# Patient Record
Sex: Female | Born: 1970 | Race: White | Hispanic: No | Marital: Married | Smoking: Never smoker
Health system: Southern US, Community
[De-identification: ages and names within clinical notes are randomized; demographics above are authoritative.]

## PROBLEM LIST (undated history)

## (undated) HISTORY — PX: TEMPOROMANDIBULAR JOINT SURGERY: SHX35

---

## 2011-05-21 ENCOUNTER — Other Ambulatory Visit (HOSPITAL_COMMUNITY): Payer: Self-pay | Admitting: Obstetrics and Gynecology

## 2011-05-21 DIAGNOSIS — N979 Female infertility, unspecified: Secondary | ICD-10-CM

## 2011-05-26 ENCOUNTER — Ambulatory Visit (HOSPITAL_COMMUNITY)
Admission: RE | Admit: 2011-05-26 | Discharge: 2011-05-26 | Disposition: A | Discharge status: Home / Self Care | Payer: BC Managed Care – PPO | Source: Ambulatory Visit | Attending: Obstetrics and Gynecology | Admitting: Obstetrics and Gynecology

## 2011-05-26 DIAGNOSIS — N979 Female infertility, unspecified: Secondary | ICD-10-CM

## 2011-05-26 MED ORDER — IOHEXOL 300 MG/ML  SOLN
7.0000 mL | Freq: Once | INTRAMUSCULAR | Status: AC | PRN
  Administered 2011-05-26: 7 mL

## 2011-08-04 NOTE — L&D Delivery Note (Signed)
Delivery Note At 9:06 AM a viable and healthy female was delivered via Vaginal, Spontaneous Delivery (Presentation: Right Occiput Anterior).  APGAR: 2, 9; weight 6 lb 1.9 oz (2775 g).   Placenta status: Intact, Spontaneous Pathology.  Cord: 3 vessels with the following complications: None.  Cord pH: none.   Anesthesia: Epidural  Episiotomy: None Lacerations: 1st degree Suture Repair: 3.0 chromic Est. Blood Loss (mL): 350  Mom to postpartum.  Baby to nursery-stable.  Lizet Kelso A 04/28/2012, 1:58 PM

## 2011-09-14 LAB — OB RESULTS CONSOLE ABO/RH

## 2011-09-14 LAB — OB RESULTS CONSOLE RUBELLA ANTIBODY, IGM: Rubella: IMMUNE

## 2011-09-14 LAB — OB RESULTS CONSOLE ANTIBODY SCREEN: Antibody Screen: NEGATIVE

## 2011-09-14 LAB — OB RESULTS CONSOLE HEPATITIS B SURFACE ANTIGEN: Hepatitis B Surface Ag: NEGATIVE

## 2012-04-28 ENCOUNTER — Encounter (HOSPITAL_COMMUNITY): Payer: Self-pay | Admitting: *Deleted

## 2012-04-28 ENCOUNTER — Inpatient Hospital Stay (HOSPITAL_COMMUNITY): Payer: BC Managed Care – PPO | Admitting: Anesthesiology

## 2012-04-28 ENCOUNTER — Encounter (HOSPITAL_COMMUNITY): Payer: Self-pay | Admitting: Anesthesiology

## 2012-04-28 ENCOUNTER — Inpatient Hospital Stay (HOSPITAL_COMMUNITY)
Admission: AD | Admit: 2012-04-28 | Discharge: 2012-04-30 | DRG: 373 | Disposition: A | Discharge status: Home / Self Care | Payer: BC Managed Care – PPO | Source: Ambulatory Visit | Attending: Obstetrics and Gynecology | Admitting: Obstetrics and Gynecology

## 2012-04-28 DIAGNOSIS — O09529 Supervision of elderly multigravida, unspecified trimester: Secondary | ICD-10-CM | POA: Diagnosis present

## 2012-04-28 DIAGNOSIS — O480 Post-term pregnancy: Secondary | ICD-10-CM | POA: Diagnosis present

## 2012-04-28 DIAGNOSIS — O99892 Other specified diseases and conditions complicating childbirth: Secondary | ICD-10-CM | POA: Diagnosis present

## 2012-04-28 DIAGNOSIS — Z22330 Carrier of Group B streptococcus: Secondary | ICD-10-CM

## 2012-04-28 LAB — CBC
Hemoglobin: 11.7 g/dL — ABNORMAL LOW (ref 12.0–15.0)
MCHC: 34.2 g/dL (ref 30.0–36.0)
RDW: 13.6 % (ref 11.5–15.5)

## 2012-04-28 LAB — RPR: RPR Ser Ql: NONREACTIVE

## 2012-04-28 LAB — ABO/RH: ABO/RH(D): AB POS

## 2012-04-28 LAB — TYPE AND SCREEN

## 2012-04-28 MED ORDER — SIMETHICONE 80 MG PO CHEW: 80.0000 mg | CHEWABLE_TABLET | ORAL | Status: DC | PRN

## 2012-04-28 MED ORDER — TERBUTALINE SULFATE 1 MG/ML IJ SOLN: 0.2500 mg | Freq: Once | INTRAMUSCULAR | Status: DC | PRN

## 2012-04-28 MED ORDER — SENNOSIDES-DOCUSATE SODIUM 8.6-50 MG PO TABS
2.0000 | ORAL_TABLET | Freq: Every day | ORAL | Status: DC
  Administered 2012-04-28 – 2012-04-29 (×2): 2 via ORAL

## 2012-04-28 MED ORDER — NALBUPHINE SYRINGE 5 MG/0.5 ML
5.0000 mg | INJECTION | INTRAMUSCULAR | Status: DC | PRN
  Administered 2012-04-28: 5 mg via INTRAVENOUS
  Filled 2012-04-28: qty 0.5

## 2012-04-28 MED ORDER — ONDANSETRON HCL 4 MG/2ML IJ SOLN
4.0000 mg | Freq: Four times a day (QID) | INTRAMUSCULAR | Status: DC | PRN
  Administered 2012-04-28: 4 mg via INTRAVENOUS
  Filled 2012-04-28: qty 2

## 2012-04-28 MED ORDER — IBUPROFEN 600 MG PO TABS: 600.0000 mg | ORAL_TABLET | Freq: Four times a day (QID) | ORAL | Status: DC | PRN

## 2012-04-28 MED ORDER — PRENATAL MULTIVITAMIN CH
1.0000 | ORAL_TABLET | Freq: Every day | ORAL | Status: DC
  Administered 2012-04-28 – 2012-04-30 (×3): 1 via ORAL
  Filled 2012-04-28 (×3): qty 1

## 2012-04-28 MED ORDER — FENTANYL 2.5 MCG/ML BUPIVACAINE 1/10 % EPIDURAL INFUSION (WH - ANES)
14.0000 mL/h | INTRAMUSCULAR | Status: DC
  Administered 2012-04-28: 14 mL/h via EPIDURAL
  Filled 2012-04-28 (×2): qty 60

## 2012-04-28 MED ORDER — LANOLIN HYDROUS EX OINT: TOPICAL_OINTMENT | CUTANEOUS | Status: DC | PRN

## 2012-04-28 MED ORDER — LACTATED RINGERS IV SOLN: 500.0000 mL | Freq: Once | INTRAVENOUS | Status: DC

## 2012-04-28 MED ORDER — LACTATED RINGERS IV SOLN: 500.0000 mL | INTRAVENOUS | Status: DC | PRN

## 2012-04-28 MED ORDER — LACTATED RINGERS IV SOLN: INTRAVENOUS | Status: DC

## 2012-04-28 MED ORDER — PHENYLEPHRINE 40 MCG/ML (10ML) SYRINGE FOR IV PUSH (FOR BLOOD PRESSURE SUPPORT)
80.0000 ug | PREFILLED_SYRINGE | INTRAVENOUS | Status: DC | PRN
  Filled 2012-04-28: qty 5

## 2012-04-28 MED ORDER — EPHEDRINE 5 MG/ML INJ: 10.0000 mg | INTRAVENOUS | Status: DC | PRN

## 2012-04-28 MED ORDER — INFLUENZA VIRUS VACC SPLIT PF IM SUSP
0.5000 mL | INTRAMUSCULAR | Status: AC
  Administered 2012-04-29: 0.5 mL via INTRAMUSCULAR

## 2012-04-28 MED ORDER — IBUPROFEN 600 MG PO TABS
600.0000 mg | ORAL_TABLET | Freq: Four times a day (QID) | ORAL | Status: DC
  Administered 2012-04-28 – 2012-04-30 (×8): 600 mg via ORAL
  Filled 2012-04-28 (×8): qty 1

## 2012-04-28 MED ORDER — PENICILLIN G POTASSIUM 5000000 UNITS IJ SOLR
2.5000 10*6.[IU] | INTRAVENOUS | Status: DC
  Administered 2012-04-28: 2.5 10*6.[IU] via INTRAVENOUS
  Filled 2012-04-28 (×3): qty 2.5

## 2012-04-28 MED ORDER — CITRIC ACID-SODIUM CITRATE 334-500 MG/5ML PO SOLN: 30.0000 mL | ORAL | Status: DC | PRN

## 2012-04-28 MED ORDER — ACETAMINOPHEN 325 MG PO TABS: 650.0000 mg | ORAL_TABLET | ORAL | Status: DC | PRN

## 2012-04-28 MED ORDER — DIBUCAINE 1 % RE OINT: 1.0000 "application " | TOPICAL_OINTMENT | RECTAL | Status: DC | PRN

## 2012-04-28 MED ORDER — EPHEDRINE 5 MG/ML INJ
10.0000 mg | INTRAVENOUS | Status: DC | PRN
  Filled 2012-04-28: qty 4

## 2012-04-28 MED ORDER — ONDANSETRON HCL 4 MG/2ML IJ SOLN: 4.0000 mg | INTRAMUSCULAR | Status: DC | PRN

## 2012-04-28 MED ORDER — TETANUS-DIPHTH-ACELL PERTUSSIS 5-2.5-18.5 LF-MCG/0.5 IM SUSP: 0.5000 mL | Freq: Once | INTRAMUSCULAR | Status: DC

## 2012-04-28 MED ORDER — OXYTOCIN 40 UNITS IN LACTATED RINGERS INFUSION - SIMPLE MED
1.0000 m[IU]/min | INTRAVENOUS | Status: DC
  Administered 2012-04-28: 2 m[IU]/min via INTRAVENOUS
  Filled 2012-04-28: qty 1000

## 2012-04-28 MED ORDER — OXYCODONE-ACETAMINOPHEN 5-325 MG PO TABS: 1.0000 | ORAL_TABLET | ORAL | Status: DC | PRN

## 2012-04-28 MED ORDER — PHENYLEPHRINE 40 MCG/ML (10ML) SYRINGE FOR IV PUSH (FOR BLOOD PRESSURE SUPPORT): 80.0000 ug | PREFILLED_SYRINGE | INTRAVENOUS | Status: DC | PRN

## 2012-04-28 MED ORDER — DIPHENHYDRAMINE HCL 50 MG/ML IJ SOLN: 12.5000 mg | INTRAMUSCULAR | Status: DC | PRN

## 2012-04-28 MED ORDER — FENTANYL 2.5 MCG/ML BUPIVACAINE 1/10 % EPIDURAL INFUSION (WH - ANES)
INTRAMUSCULAR | Status: DC | PRN
  Administered 2012-04-28: 14 mL/h via EPIDURAL

## 2012-04-28 MED ORDER — OXYTOCIN BOLUS FROM INFUSION
500.0000 mL | Freq: Once | INTRAVENOUS | Status: DC
  Filled 2012-04-28: qty 500

## 2012-04-28 MED ORDER — BENZOCAINE-MENTHOL 20-0.5 % EX AERO
1.0000 "application " | INHALATION_SPRAY | CUTANEOUS | Status: DC | PRN
  Administered 2012-04-28: 1 via TOPICAL
  Filled 2012-04-28: qty 56

## 2012-04-28 MED ORDER — ONDANSETRON HCL 4 MG PO TABS: 4.0000 mg | ORAL_TABLET | ORAL | Status: DC | PRN

## 2012-04-28 MED ORDER — WITCH HAZEL-GLYCERIN EX PADS: 1.0000 "application " | MEDICATED_PAD | CUTANEOUS | Status: DC | PRN

## 2012-04-28 MED ORDER — ZOLPIDEM TARTRATE 5 MG PO TABS: 5.0000 mg | ORAL_TABLET | Freq: Every evening | ORAL | Status: DC | PRN

## 2012-04-28 MED ORDER — DIPHENHYDRAMINE HCL 25 MG PO CAPS: 25.0000 mg | ORAL_CAPSULE | Freq: Four times a day (QID) | ORAL | Status: DC | PRN

## 2012-04-28 MED ORDER — LIDOCAINE HCL (PF) 1 % IJ SOLN
INTRAMUSCULAR | Status: DC | PRN
  Administered 2012-04-28 (×2): 9 mL

## 2012-04-28 MED ORDER — OXYTOCIN 40 UNITS IN LACTATED RINGERS INFUSION - SIMPLE MED
62.5000 mL/h | Freq: Once | INTRAVENOUS | Status: AC
  Administered 2012-04-28: 62.5 mL/h via INTRAVENOUS

## 2012-04-28 MED ORDER — OXYCODONE-ACETAMINOPHEN 5-325 MG PO TABS
1.0000 | ORAL_TABLET | ORAL | Status: DC | PRN
  Administered 2012-04-28 – 2012-04-30 (×9): 1 via ORAL
  Filled 2012-04-28 (×9): qty 1

## 2012-04-28 MED ORDER — FLEET ENEMA 7-19 GM/118ML RE ENEM: 1.0000 | ENEMA | Freq: Every day | RECTAL | Status: DC | PRN

## 2012-04-28 MED ORDER — LIDOCAINE HCL (PF) 1 % IJ SOLN
30.0000 mL | INTRAMUSCULAR | Status: DC | PRN
  Filled 2012-04-28: qty 30

## 2012-04-28 MED ORDER — FERROUS SULFATE 325 (65 FE) MG PO TABS
325.0000 mg | ORAL_TABLET | Freq: Two times a day (BID) | ORAL | Status: DC
  Administered 2012-04-28 – 2012-04-30 (×4): 325 mg via ORAL
  Filled 2012-04-28 (×4): qty 1

## 2012-04-28 MED ORDER — PENICILLIN G POTASSIUM 5000000 UNITS IJ SOLR
5.0000 10*6.[IU] | Freq: Once | INTRAVENOUS | Status: AC
  Administered 2012-04-28: 5 10*6.[IU] via INTRAVENOUS
  Filled 2012-04-28: qty 5

## 2012-04-28 NOTE — Progress Notes (Signed)
S: Epidural Comfortable  O: VE bloody show 6/100/-1 LOA sl asynclytic AROM clear fluid  Tracing: baseline 120 (+) accel  Ctx q 5-6 mins  IMP: Active phase GBS cx (+) on PCN Postdate  P) cont PCN. Epidural. Start pitocin augmentation

## 2012-04-28 NOTE — Anesthesia Preprocedure Evaluation (Signed)
Anesthesia Evaluation  Patient identified by MRN, date of birth, ID band Patient awake    Reviewed: Allergy & Precautions, H&P , Patient's Chart, lab work & pertinent test results  Airway Mallampati: II TM Distance: >3 FB Neck ROM: full    Dental No notable dental hx.    Pulmonary neg pulmonary ROS,  breath sounds clear to auscultation  Pulmonary exam normal       Cardiovascular negative cardio ROS      Neuro/Psych negative neurological ROS  negative psych ROS   GI/Hepatic negative GI ROS, Neg liver ROS,   Endo/Other  negative endocrine ROS  Renal/GU negative Renal ROS  negative genitourinary   Musculoskeletal negative musculoskeletal ROS (+)   Abdominal Normal abdominal exam  (+)   Peds negative pediatric ROS (+)  Hematology negative hematology ROS (+)   Anesthesia Other Findings   Reproductive/Obstetrics (+) Pregnancy                           Anesthesia Physical Anesthesia Plan  ASA: II  Anesthesia Plan: Epidural   Post-op Pain Management:    Induction:   Airway Management Planned:   Additional Equipment:   Intra-op Plan:   Post-operative Plan:   Informed Consent: I have reviewed the patients History and Physical, chart, labs and discussed the procedure including the risks, benefits and alternatives for the proposed anesthesia with the patient or authorized representative who has indicated his/her understanding and acceptance.     Plan Discussed with:   Anesthesia Plan Comments:         Anesthesia Quick Evaluation  

## 2012-04-28 NOTE — Consult Note (Signed)
Neonatology Note:  Attendance at Code Apgar:   Our team responded to a Code Apgar call to room # 168 following NSVD, due to infant with apnea. The mother is a G3P0A2 AB pos, GBS pos with adequate antibiotic prophylaxis during labor. ROM occurred 5 hours PTD and the fluid was lightly meconium-stained.  At delivery, the baby was floppy and apneic. The OB nursing staff in attendance gave vigorous stimulation and a Code Apgar was called. Our team arrived at 1 1/2 minutes of life, at which time the baby was blue, but starting to cry. We bulb suctioned and gave BBO2. We DeLee suctioned to the stomach and got 4 ml very light green fluid out. We did CPT bilaterally due to coarse breath sounds, with improvement. Ap 1/9.  I spoke with the parents in the DR, then transferred the baby to the Pediatrician's care.   Doretha Sou, MD

## 2012-04-28 NOTE — Anesthesia Procedure Notes (Signed)
Epidural Patient location during procedure: OB Start time: 04/28/2012 3:01 AM End time: 04/28/2012 3:06 AM  Staffing Anesthesiologist: Sandrea Hughs Performed by: anesthesiologist   Preanesthetic Checklist Completed: patient identified, site marked, surgical consent, pre-op evaluation, timeout performed, IV checked, risks and benefits discussed and monitors and equipment checked  Epidural Patient position: sitting Prep: site prepped and draped and DuraPrep Patient monitoring: continuous pulse ox and blood pressure Approach: midline Injection technique: LOR air  Needle:  Needle type: Tuohy  Needle gauge: 17 G Needle length: 9 cm and 9 Needle insertion depth: 6 cm Catheter type: closed end flexible Catheter size: 19 Gauge Catheter at skin depth: 11 cm Test dose: negative and Other  Assessment Sensory level: T9 Events: blood not aspirated, injection not painful, no injection resistance, negative IV test and no paresthesia  Additional Notes Reason for block:procedure for pain

## 2012-04-28 NOTE — Anesthesia Postprocedure Evaluation (Signed)
  Anesthesia Post-op Note  Patient: Emily Solomon  Procedure(s) Performed: * No procedures listed *  Patient Location: Mother/Baby  Anesthesia Type: Epidural  Level of Consciousness: awake  Airway and Oxygen Therapy: Patient Spontanous Breathing  Post-op Pain: mild  Post-op Assessment: Patient's Cardiovascular Status Stable and Respiratory Function Stable  Post-op Vital Signs: stable  Complications: No apparent anesthesia complications

## 2012-04-28 NOTE — Progress Notes (Signed)
Emily Solomon is a 41 y.o. G3P0020 at [redacted]w[redacted]d by LMP admitted for active labor  Subjective: Chief Complaint  Patient presents with  . Labor Eval    Objective: BP 120/68  Pulse 79  Temp 98.9 F (37.2 C) (Oral)  Resp 20  Ht 5' 3.5" (1.613 m)  Wt 79.833 kg (176 lb)  BMI 30.69 kg/m2  SpO2 100%  LMP 05/19/2011      FHT:  Baseline 120   (+)early decels and variables w/ ctx and post contraction (+) scalp stimulation UC:   irregular, every 1-3  minutes SVE:   10 cm dilated, 100%faced, +2tation   Labs: Lab Results  Component Value Date   WBC 14.0* 04/28/2012   HGB 11.7* 04/28/2012   HCT 34.2* 04/28/2012   MCV 88.4 04/28/2012   PLT 260 04/28/2012    Assessment / Plan: Spontaneous labor, progressing normally w/ prob cord compression  P) cont pushing Anticipated MOD:  NSVD  Doneta Bayman A 04/28/2012, 7:59 AM

## 2012-04-28 NOTE — H&P (Signed)
Emily Solomon is a 41 y.o. female presenting for admission due to labor @ 40 6/7 week. Intact membrane. GBS cx (+) Maternal Medical History:  Reason for admission: Reason for admission: contractions.  Contractions: Onset was 3-5 hours ago.   Frequency: regular.   Perceived severity is strong.    Fetal activity: Perceived fetal activity is normal.    Prenatal complications: no prenatal complications Prenatal Complications - Diabetes: none.    OB History    Grav Para Term Preterm Abortions TAB SAB Ect Mult Living   3    2  2         No past medical history on file. No past surgical history on file. Family History: family history is not on file. Social History:  reports that she has never smoked. She does not have any smokeless tobacco history on file. She reports that she does not drink alcohol or use illicit drugs.   Prenatal Transfer Tool  Maternal Diabetes: No Genetic Screening: Normal Maternal Ultrasounds/Referrals: Normal Fetal Ultrasounds or other Referrals:  None Maternal Substance Abuse:  No Significant Maternal Medications:  None Significant Maternal Lab Results:  Lab values include: Group B Strep positive Other Comments:  None  Review of Systems  All other systems reviewed and are negative.    Dilation: 6 Effacement (%): 100 Station: 0 Exam by:: anya Stalling RN Blood pressure 116/67, pulse 86, temperature 97.8 F (36.6 C), temperature source Oral, resp. rate 20, height 5' 3.5" (1.613 m), weight 79.833 kg (176 lb), last menstrual period 05/19/2011, SpO2 80.00%. Maternal Exam:  Uterine Assessment: Contraction strength is moderate.  Contraction frequency is regular.   Abdomen: Patient reports no abdominal tenderness. Fetal presentation: vertex  Introitus: Normal vulva. Ferning test: not done.  Nitrazine test: not done.  Pelvis: adequate for delivery.   Cervix: Cervix evaluated by digital exam.     Physical Exam  Constitutional: She is oriented to person,  place, and time. She appears well-developed and well-nourished.  HENT:  Head: Normocephalic.  Eyes: EOM are normal.  Neck: Neck supple.  Cardiovascular: Normal rate and regular rhythm.   Respiratory: Breath sounds normal.  GI: Soft.  Musculoskeletal: She exhibits no edema.  Neurological: She is alert and oriented to person, place, and time.  Skin: Skin is warm and dry.    Prenatal labs: ABO, Rh: AB/Positive/-- (02/11 0000) Antibody: Negative (02/11 0000) Rubella: Immune (02/11 0000) RPR: Nonreactive (02/11 0000)  HBsAg: Negative (02/11 0000)  HIV: Non-reactive (02/11 0000)  GBS: Positive (08/19 0000)   Assessment/Plan: Postdate Labor  GBS cx(+) P) admit PCN prophylaxis. Routine labs. Amniotomy prn. Epidural. Pitocin prn   Emily Solomon A 04/28/2012, 3:23 AM

## 2012-04-29 LAB — CBC
HCT: 28.8 % — ABNORMAL LOW (ref 36.0–46.0)
Platelets: 189 10*3/uL (ref 150–400)
RBC: 3.15 MIL/uL — ABNORMAL LOW (ref 3.87–5.11)
RDW: 14.3 % (ref 11.5–15.5)
WBC: 14.7 10*3/uL — ABNORMAL HIGH (ref 4.0–10.5)

## 2012-04-29 NOTE — Progress Notes (Signed)
Patient ID: Emily Solomon, female   DOB: July 26, 1971, 41 y.o.   MRN: [id number] PPD # 1  Subjective: Pt reports feeling well/ Pain controlled with ibuprofen and percocet Tolerating po/ Voiding without problems/ No n/v Bleeding is moderate Newborn info:  Information for the patient's newborn:  Dafna, Romo [[id number]]  female  / circ pending; infant has not voided and poor temp regulation at present/ Feeding: breast   Objective:  VS: Blood pressure 115/55, pulse 74, temperature 97.9 F (36.6 C), temperature source Oral, resp. rate 20.    Basename 04/29/12 0550 04/28/12 0210  WBC 14.7* 14.0*  HGB 9.5* 11.7*  HCT 28.8* 34.2*  PLT 189 260    Blood type: --/--/AB POS, AB POS (09/26 0210) Rubella: Immune (02/11 0000)    Physical Exam:  General: A & O x 3  alert, cooperative and no distress CV: Regular rate and rhythm Resp: clear Abdomen: soft, nontender, normal bowel sounds Uterine Fundus: firm, below umbilicus, nontender Perineum: healing with good reapproximation Lochia: minimal Ext: edema trace and Homans sign is negative, no sign of DVT   A/P: PPD # 1/ G3P1021/ S/P:spontaneous vaginal delivery with 1st deg repair Doing well Continue routine post partum orders Anticipate D/C home in AM    Demetrius Revel, MSN, [facility] 04/29/2012, 10:19 AM

## 2012-04-30 MED ORDER — IBUPROFEN 600 MG PO TABS: 600.0000 mg | ORAL_TABLET | Freq: Four times a day (QID) | ORAL | Status: DC

## 2012-04-30 MED ORDER — OXYCODONE-ACETAMINOPHEN 5-325 MG PO TABS: 1.0000 | ORAL_TABLET | ORAL | Status: DC | PRN

## 2012-04-30 NOTE — Progress Notes (Signed)
Post Partum Day #2            Information for the patient's newborn:  Emily, Solomon [[id number]]  female   / circumcision done Feeding: breast  Subjective: No HA, SOB, CP, F/C, breast symptoms. Pain controlled w/ Motrin and Percocet. Normal vaginal bleeding, no clots.    C/O urinary incontinence.   Objective:  Temp:  [98 F (36.7 C)-98.5 F (36.9 C)] 98 F (36.7 C) (09/28 0615) Pulse Rate:  [69-88] 69  (09/28 0615) Resp:  [18-20] 18  (09/28 0615) BP: (98-125)/(66-81) 98/66 mmHg (09/28 0615)  No intake or output data in the 24 hours ending 04/30/12 1003     Basename 04/29/12 0550 04/28/12 0210  WBC 14.7* 14.0*  HGB 9.5* 11.7*  HCT 28.8* 34.2*  PLT 189 260    Blood type: --/--/AB POS, AB POS (09/26 0210) Rubella: Immune (02/11 0000)    Physical Exam:  General: alert, cooperative and no distress Uterine Fundus: firm Lochia: appropriate Perineum: repair intact, edema none DVT Evaluation: Negative Homan's sign. No cords or calf tenderness.    Assessment/Plan: PPD # 2 / 41 y.o., [id number] S/P:spontaneous vaginal   Active Problems:  NSVD (normal spontaneous vaginal delivery, 9/26)  Postpartum care following vaginal delivery Urinary incontinence postpartum   normal postpartum exam  Continue current postpartum care  Flu vaccine given  Encouraged frequent voids during daytime, Kegel exercises once perineum not sore  D/C home   LOS: 2 days   Emily Solomon, CNM, MSN 04/30/2012, 10:03 AM

## 2012-04-30 NOTE — Discharge Summary (Signed)
Obstetric Discharge Summary Reason for Admission: onset of labor Prenatal Procedures: ultrasound Intrapartum Procedures: spontaneous vaginal delivery, GBS prophylaxis and epidural Postpartum Procedures: flu vaccine Complications-Operative and Postpartum: 1st degree perineal laceration Hemoglobin  Date Value Range Status  04/29/2012 9.5* 12.0 - 15.0 g/dL Final     DELTA CHECK NOTED     REPEATED TO VERIFY     HCT  Date Value Range Status  04/29/2012 28.8* 36.0 - 46.0 % Final    Physical Exam:  General: alert, cooperative and no distress Lochia: appropriate Uterine Fundus: firm Incision: healing well DVT Evaluation: Negative Homan's sign. No significant calf/ankle edema.  Discharge Diagnoses: Term Pregnancy-delivered  Discharge Information: Date: 04/30/2012 Activity: pelvic rest Diet: routine Medications: PNV, Ibuprofen and Percocet Condition: stable Instructions: refer to practice specific booklet Discharge to: home Follow-up Information    Follow up with COUSINS,SHERONETTE A, MD. Schedule an appointment as soon as possible for a visit in 6 weeks.   Contact information:   [address] [place] [address] [phone]          Newborn Data: Live born female  Birth Weight: 6 lb 1.9 oz (2775 g) APGAR: 2, 9  Home with mother.  Emily Solomon,Emily Solomon 04/30/2012, 10:16 AM

## 2012-12-13 ENCOUNTER — Other Ambulatory Visit: Payer: Self-pay

## 2012-12-13 DIAGNOSIS — Z1231 Encounter for screening mammogram for malignant neoplasm of breast: Secondary | ICD-10-CM

## 2013-01-19 ENCOUNTER — Ambulatory Visit: Payer: BC Managed Care – PPO

## 2013-02-28 ENCOUNTER — Ambulatory Visit
Admission: RE | Admit: 2013-02-28 | Discharge: 2013-02-28 | Disposition: A | Discharge status: Home / Self Care | Payer: BC Managed Care – PPO | Source: Ambulatory Visit

## 2013-02-28 DIAGNOSIS — Z1231 Encounter for screening mammogram for malignant neoplasm of breast: Secondary | ICD-10-CM

## 2014-03-29 ENCOUNTER — Other Ambulatory Visit: Payer: Self-pay

## 2014-03-29 DIAGNOSIS — Z1231 Encounter for screening mammogram for malignant neoplasm of breast: Secondary | ICD-10-CM

## 2014-04-24 ENCOUNTER — Ambulatory Visit: Payer: BC Managed Care – PPO

## 2014-04-30 ENCOUNTER — Ambulatory Visit
Admission: RE | Admit: 2014-04-30 | Discharge: 2014-04-30 | Disposition: A | Discharge status: Home / Self Care | Payer: Managed Care, Other (non HMO) | Source: Ambulatory Visit

## 2014-04-30 DIAGNOSIS — Z1231 Encounter for screening mammogram for malignant neoplasm of breast: Secondary | ICD-10-CM

## 2014-06-04 ENCOUNTER — Encounter (HOSPITAL_COMMUNITY): Payer: Self-pay | Admitting: *Deleted

## 2015-03-07 ENCOUNTER — Other Ambulatory Visit: Payer: Self-pay

## 2015-03-07 DIAGNOSIS — Z1231 Encounter for screening mammogram for malignant neoplasm of breast: Secondary | ICD-10-CM

## 2015-05-02 ENCOUNTER — Ambulatory Visit
Admission: RE | Admit: 2015-05-02 | Discharge: 2015-05-02 | Disposition: A | Discharge status: Home / Self Care | Payer: Managed Care, Other (non HMO) | Source: Ambulatory Visit

## 2015-05-02 DIAGNOSIS — Z1231 Encounter for screening mammogram for malignant neoplasm of breast: Secondary | ICD-10-CM

## 2016-05-08 ENCOUNTER — Other Ambulatory Visit: Payer: Self-pay | Admitting: Obstetrics and Gynecology

## 2016-05-08 DIAGNOSIS — Z1231 Encounter for screening mammogram for malignant neoplasm of breast: Secondary | ICD-10-CM

## 2016-05-29 ENCOUNTER — Ambulatory Visit: Payer: Managed Care, Other (non HMO)

## 2018-08-08 ENCOUNTER — Telehealth: Payer: Self-pay | Admitting: Family Medicine

## 2018-08-08 NOTE — Telephone Encounter (Signed)
Pt called in today to return a call from [place].

## 2018-08-08 NOTE — Telephone Encounter (Signed)
Please advise 

## 2018-08-10 NOTE — Telephone Encounter (Signed)
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 Thank you

## 2018-08-29 ENCOUNTER — Other Ambulatory Visit: Payer: Self-pay | Admitting: Obstetrics and Gynecology

## 2018-08-29 DIAGNOSIS — R2232 Localized swelling, mass and lump, left upper limb: Secondary | ICD-10-CM

## 2018-09-06 ENCOUNTER — Ambulatory Visit
Admission: RE | Admit: 2018-09-06 | Discharge: 2018-09-06 | Disposition: A | Discharge status: Home / Self Care | Payer: Managed Care, Other (non HMO) | Source: Ambulatory Visit | Attending: Obstetrics and Gynecology | Admitting: Obstetrics and Gynecology

## 2018-09-06 ENCOUNTER — Ambulatory Visit
Admission: RE | Admit: 2018-09-06 | Discharge: 2018-09-06 | Disposition: A | Discharge status: Home / Self Care | Payer: 59 | Source: Ambulatory Visit | Attending: Obstetrics and Gynecology | Admitting: Obstetrics and Gynecology

## 2018-09-06 DIAGNOSIS — R2232 Localized swelling, mass and lump, left upper limb: Secondary | ICD-10-CM

## 2020-07-15 LAB — COLOGUARD: COLOGUARD: NEGATIVE

## 2020-08-12 ENCOUNTER — Ambulatory Visit (INDEPENDENT_AMBULATORY_CARE_PROVIDER_SITE_OTHER): Payer: 59 | Admitting: Obstetrics & Gynecology

## 2020-08-12 ENCOUNTER — Telehealth: Payer: Self-pay | Admitting: *Deleted

## 2020-08-12 ENCOUNTER — Other Ambulatory Visit: Payer: Self-pay

## 2020-08-12 ENCOUNTER — Encounter: Payer: Self-pay | Admitting: Obstetrics & Gynecology

## 2020-08-12 VITALS — BP 126/78 | Ht 63.5 in | Wt 143.0 lb

## 2020-08-12 DIAGNOSIS — N951 Menopausal and female climacteric states: Secondary | ICD-10-CM | POA: Diagnosis not present

## 2020-08-12 MED ORDER — ESTRADIOL 0.1 MG/24HR TD PTTW: 1.0000 | MEDICATED_PATCH | TRANSDERMAL | 4 refills | Status: DC

## 2020-08-12 MED ORDER — PROGESTERONE MICRONIZED 100 MG PO CAPS: 100.0000 mg | ORAL_CAPSULE | Freq: Every day | ORAL | 4 refills | Status: AC

## 2020-08-12 NOTE — Progress Notes (Signed)
    Emily Solomon [birth year] [id number]        50 y.o.  [id number]   RP: Symptomatic menopausal Sxs  HPI:  No menses x 04/2020.  Occasional hot flushes.  Night sweats with insomnia.  Tiredness, difficulty loosing weight at the waist.  Low libido.  No pain with IC.  Shiner 108 on 06/24/2020.  Mother with early stage Breast Ca Dxed at age 33.  No Fam Hx or personal h/o strokes/DVT/PE.  Healthy nutrition and good fitness.  BMI 24.93.  OB History  Gravida Para Term Preterm AB Living  3 1 1   2 1   SAB IAB Ectopic Multiple Live Births  2       1    # Outcome Date GA Lbr Len/2nd Weight Sex Delivery Anes PTL Lv  3 Term 04/28/12 [id number]:35 / 03:31 6 lb 1.9 oz (2.775 kg) M Vag-Spont EPI  LIV  2 SAB 2012          1 SAB 2012            Past medical history,surgical history, problem list, medications, allergies, family history and social history were all reviewed and documented in the EPIC chart.   Directed ROS with pertinent positives and negatives documented in the history of present illness/assessment and plan.  Exam:  Vitals:   08/12/20 0833  BP: 126/78  Weight: 143 lb (64.9 kg)  Height: 5' 3.5" (1.613 m)   General appearance:  Normal  Gynecologic exam differed   Assessment/Plan:  50y.o. [id number]  1. Menopausal syndrome Entering menopause with no menstrual period since September 2021 [facility] with significant menopausal symptoms including occasional hot flashes and night sweats with insomnia, fatigue and low libido.  Mother with diagnosis of early stage breast cancer at age 49  No evidence of familial breast cancer and no family history or personal h/o Stroke/DVT/PE.  Decision to start on HRT.  Counseling done thoroughly on risks/benefits/usage of HRT.  Estradiol patch 0.1 twice weekly and Prometrium 100 mg PO HS sent to pharmacy.  Precautions reviewed.  Will review patient's response at Annual gyn visit 10/2020 and decide if Testosterone cream needs to be added.   Counseling on low libido done.  Other orders - estradiol (VIVELLE-DOT) 0.1 MG/24HR patch; Place 1 patch (0.1 mg total) onto the skin 2 (two) times a week. - progesterone (PROMETRIUM) 100 MG capsule; Take 1 capsule (100 mg total) by mouth at bedtime.  MPrincess BruinsMD, 8:44 AM 08/12/2020

## 2020-08-12 NOTE — Telephone Encounter (Signed)
Pharmacy sent fax stating vivelle-dot patch 0.1 is not covered by her insurance. However the estradiol once weekly patch is on patient formulary list as preferred medication. Can patient have estradiol weekly patch?

## 2020-08-13 ENCOUNTER — Telehealth: Payer: Self-pay | Admitting: *Deleted

## 2020-08-13 DIAGNOSIS — N939 Abnormal uterine and vaginal bleeding, unspecified: Secondary | ICD-10-CM

## 2020-08-13 NOTE — Telephone Encounter (Signed)
Patient was seen on 08/12/20 and started on HRT patch vivelle-dot patch 0.1 mg and progesterone 100 capsule. Reports this am she went to the bathroom and noticed blood in toilet and when wiping along with clots and cramping. Reports no blood noted on pad or tampon now. Reports she was told to call if bleeding should occur. Please advise

## 2020-08-14 NOTE — Telephone Encounter (Signed)
This bleeding is happening to soon after starting on HRT to be caused by it.  So this bleeding probably means that she is not fully Menopausal (in spite of the high [facility] which can go up and down), but rather Perimenopausal.  None-the-less, this could be abnormal uterine bleeding caused by a Polyp/Fibroid/Hyperplasia/Endometrial Ca, therefore we need to investigate with a Pelvic US.  Please schedule for a Pelvic US.  Recommend stopping the Estradiol patch until the pelvic US.  Can continue on the Prometrium.

## 2020-08-14 NOTE — Telephone Encounter (Signed)
Patient informed with below note, bleeding today is only when using bathroom and wiping still. Wearing a tampon, but not noticed much blood on tampon.

## 2020-08-14 NOTE — Telephone Encounter (Signed)
Yes, can switch to Estradiol patch once a week, but let's wait on patient's f/u Pelvic US.  We'll decide at that visit what HRT to restart her on.

## 2020-08-14 NOTE — Telephone Encounter (Signed)
Patient informed, will wait until ultrasound appointment

## 2020-08-28 ENCOUNTER — Encounter: Payer: Self-pay | Admitting: Obstetrics & Gynecology

## 2020-08-28 ENCOUNTER — Ambulatory Visit (INDEPENDENT_AMBULATORY_CARE_PROVIDER_SITE_OTHER): Payer: 59 | Admitting: Obstetrics & Gynecology

## 2020-08-28 ENCOUNTER — Ambulatory Visit (INDEPENDENT_AMBULATORY_CARE_PROVIDER_SITE_OTHER): Payer: 59

## 2020-08-28 ENCOUNTER — Other Ambulatory Visit: Payer: Self-pay

## 2020-08-28 VITALS — BP 116/64 | HR 70 | Resp 14 | Ht 63.5 in | Wt 141.5 lb

## 2020-08-28 DIAGNOSIS — N939 Abnormal uterine and vaginal bleeding, unspecified: Secondary | ICD-10-CM

## 2020-08-28 DIAGNOSIS — N951 Menopausal and female climacteric states: Secondary | ICD-10-CM | POA: Diagnosis not present

## 2020-08-28 DIAGNOSIS — N914 Secondary oligomenorrhea: Secondary | ICD-10-CM

## 2020-08-28 NOTE — Progress Notes (Signed)
    Emily Solomon October 15, 1970 [id number]        50 y.o.  [id number]   RP: Oligomenorrhea for Pelvic US  HPI: Probably perimenopausal with Oligomenorrhea.  No pelvic pain.    OB History  Gravida Para Term Preterm AB Living  3 1 1   2 1   SAB IAB Ectopic Multiple Live Births  2       1    # Outcome Date GA Lbr Len/2nd Weight Sex Delivery Anes PTL Lv  3 Term 04/28/12 [id number]:35 / 03:31 6 lb 1.9 oz (2.775 kg) M Vag-Spont EPI  LIV  2 SAB 2012          1 SAB 2012            Past medical history,surgical history, problem list, medications, allergies, family history and social history were all reviewed and documented in the EPIC chart.   Directed ROS with pertinent positives and negatives documented in the history of present illness/assessment and plan.  Exam:  Vitals:   08/28/20 0834  BP: 116/64  Pulse: 70  Resp: 14  Weight: 141 lb 8 oz (64.2 kg)  Height: 5' 3.5" (1.613 m)   General appearance:  Normal  Pelvic [place]: T/V images.  The uterus is anteverted, measured at 7.28 x 4.61 x 3.51 cm.  An intramural degenerating fibroid is measured at 1.5 x 1.5 cm at the right lateral side.  The endometrial lining is thin and symmetrical measured at 7.56 mm with no mass or thickening seen.  The right ovary presents a small complex follicle.  The left ovary presents 2 small simple follicles measured at 2 cm and at 8.2 mm.  No adnexal mass otherwise.  No free fluid in the posterior cul-de-sac.    Assessment/Plan:  50y.o. G3P1021   1. Secondary oligomenorrhea Oligomenorrhea probably associated with perimenopause.  Had started hormone replacement therapy and stopped the estrogen first because of bleeding.  Has now stopped the Prometrium as well.  Pelvic ultrasound findings reassuring, thoroughly reviewed with patient.  Normal endometrial lining measured at 7.56 mm.  Small degenerating intramural fibroid measured at 1.5 cm.  Normal bilateral ovaries with follicles.  2. Perimenopause Probably  perimenopausal.  Stopped Prometrium.  Will observe at this time.  Other orders - hydrOXYzine (ATARAX/VISTARIL) 25 MG tablet; Take 1-2 tablets by mouth at bedtime as needed. - bimatoprost (LATISSE) 0.03 % ophthalmic solution - zolpidem (AMBIEN) 10 MG tablet; TAKE 1 TABLET(10 MG) BY MOUTH AT BEDTIME AS NEEDED FOR SLEEP  MPrincess BruinsMD, 8:51 AM 08/28/2020

## 2020-08-31 ENCOUNTER — Encounter: Payer: Self-pay | Admitting: Obstetrics & Gynecology

## 2020-09-02 ENCOUNTER — Ambulatory Visit: Payer: Self-pay | Admitting: Obstetrics & Gynecology

## 2020-10-03 ENCOUNTER — Other Ambulatory Visit: Payer: Self-pay | Admitting: Obstetrics & Gynecology

## 2020-10-03 DIAGNOSIS — Z1231 Encounter for screening mammogram for malignant neoplasm of breast: Secondary | ICD-10-CM

## 2020-11-26 ENCOUNTER — Ambulatory Visit: Payer: 59

## 2020-12-05 ENCOUNTER — Ambulatory Visit: Payer: 59 | Admitting: Obstetrics & Gynecology

## 2021-02-20 ENCOUNTER — Other Ambulatory Visit: Payer: Self-pay | Admitting: Obstetrics and Gynecology

## 2021-02-20 DIAGNOSIS — Z803 Family history of malignant neoplasm of breast: Secondary | ICD-10-CM

## 2021-02-27 ENCOUNTER — Other Ambulatory Visit (HOSPITAL_COMMUNITY): Payer: Self-pay | Admitting: Obstetrics and Gynecology

## 2021-02-27 DIAGNOSIS — Z803 Family history of malignant neoplasm of breast: Secondary | ICD-10-CM

## 2021-03-07 ENCOUNTER — Other Ambulatory Visit: Payer: Self-pay

## 2021-03-07 ENCOUNTER — Ambulatory Visit (HOSPITAL_COMMUNITY)
Admission: RE | Admit: 2021-03-07 | Discharge: 2021-03-07 | Disposition: A | Discharge status: Home / Self Care | Payer: No Typology Code available for payment source | Source: Ambulatory Visit | Attending: Obstetrics and Gynecology | Admitting: Obstetrics and Gynecology

## 2021-03-07 DIAGNOSIS — Z1501 Genetic susceptibility to malignant neoplasm of breast: Secondary | ICD-10-CM | POA: Diagnosis not present

## 2021-03-07 DIAGNOSIS — Z803 Family history of malignant neoplasm of breast: Secondary | ICD-10-CM | POA: Diagnosis not present

## 2021-03-07 MED ORDER — GADOBUTROL 1 MMOL/ML IV SOLN
6.0000 mL | Freq: Once | INTRAVENOUS | Status: AC | PRN
  Administered 2021-03-07: 6 mL via INTRAVENOUS

## 2021-09-18 IMAGING — MR MR BREAST BILAT WO/W CM
8 of 11 series · 30 of 48 positions shown · IV contrast (6ml GADAVIST)
Comparison: Previous exam(s).

CLINICAL DATA: 49-year-old female with a strong family history of
breast cancer.

LABS:  None obtained on site.
EXAM:
BILATERAL BREAST MRI WITH AND WITHOUT CONTRAST
TECHNIQUE: Multiplanar, multisequence MR images of both breasts were obtained
prior to and following the intravenous administration of 6 ml of
Gadavist

[Series 2: T2 · axial · 3.0mm · 0.89mm/px · z∈[-94,+98]mm · 3 of 65 slices shown]
[im 1/65]
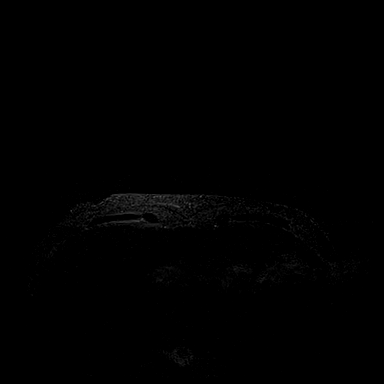
[im 33/65]
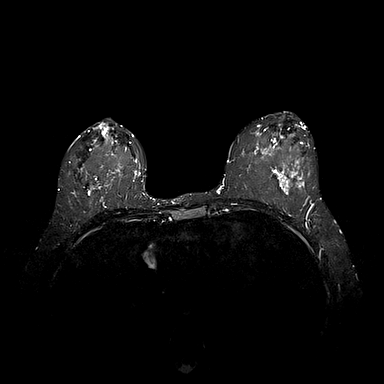
[im 65/65]
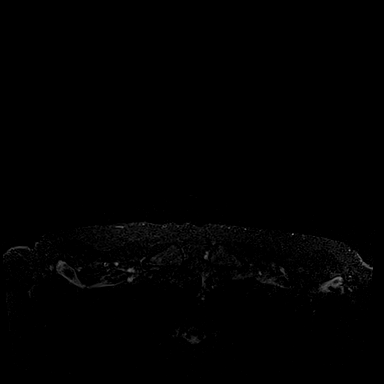

[Series 3: T1 fat-sat · axial · 1.2mm · 0.71mm/px · z∈[-93,+98]mm · 8 of 160 slices shown (1 of 4)]
[im 1/160]
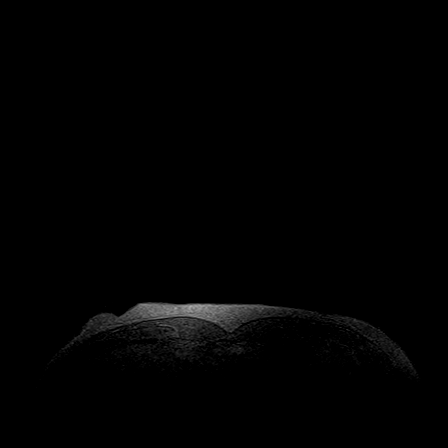
[im 23/160]
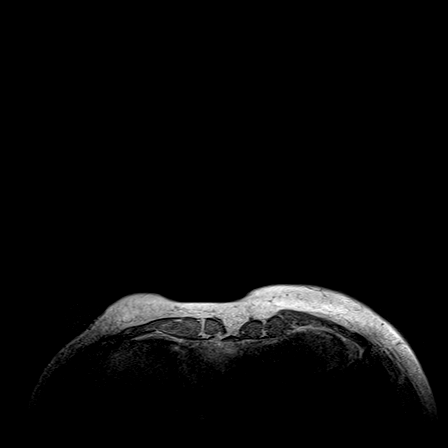
[im 46/160]
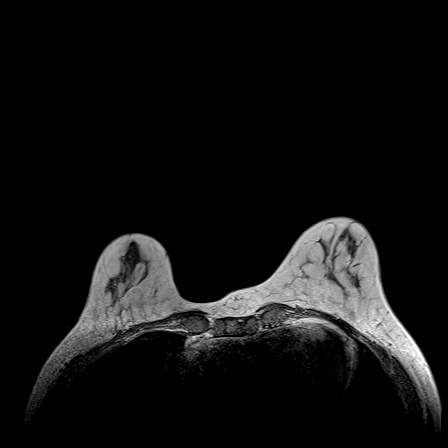
[im 69/160]
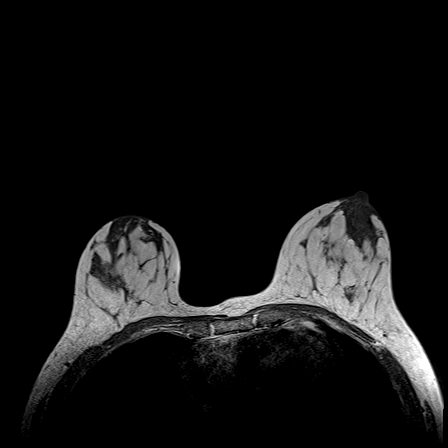
[im 91/160]
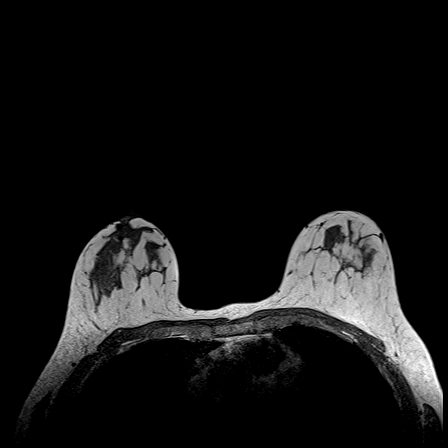
[im 114/160]
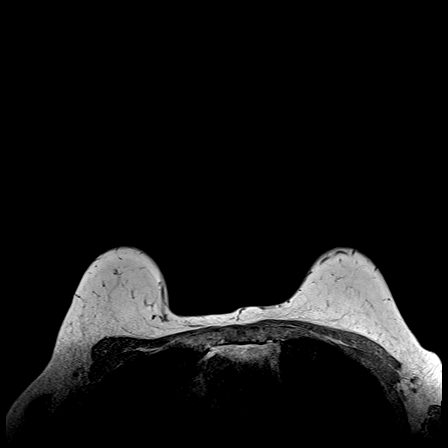
[im 137/160]
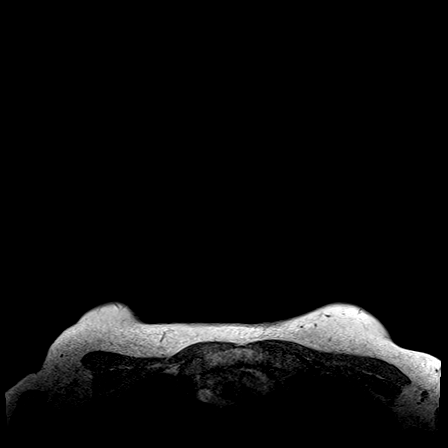
[im 160/160]
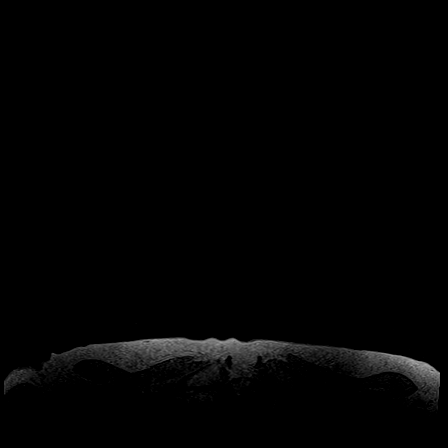

[Series 5: T1 fat-sat · axial · 1.6mm · 0.77mm/px · z∈[-86,+91]mm · 5 of 112 slices shown (2 of 4)]
[im 1/112]
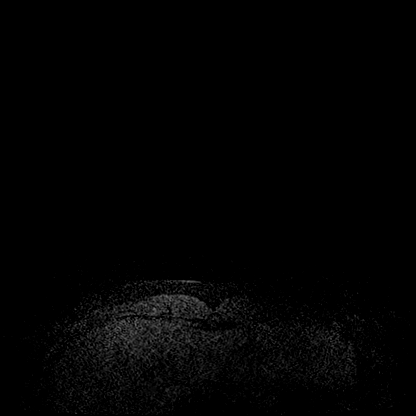
[im 28/112]
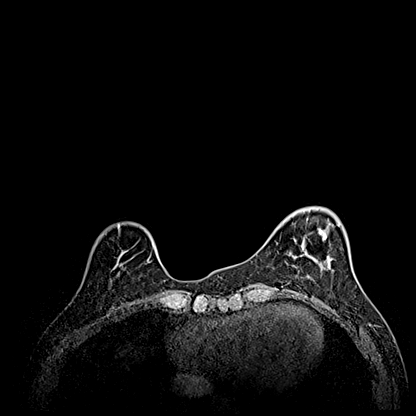
[im 56/112]
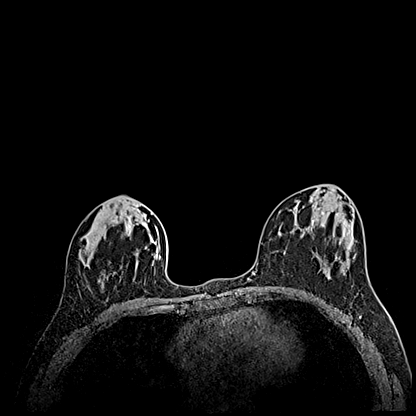
[im 84/112]
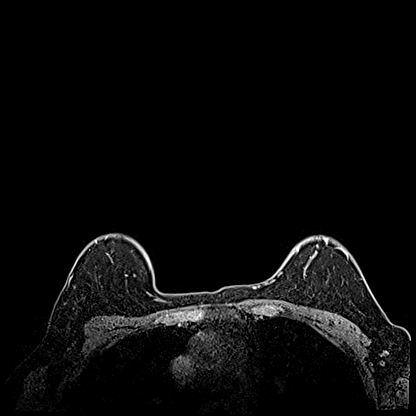
[im 112/112]
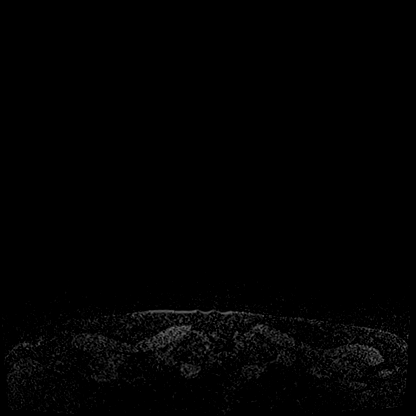

[Series 6: T1 fat-sat · axial · 1.6mm · 0.77mm/px · z∈[-86,+91]mm · 5 of 112 slices shown (3 of 4)]
[im 1/112]
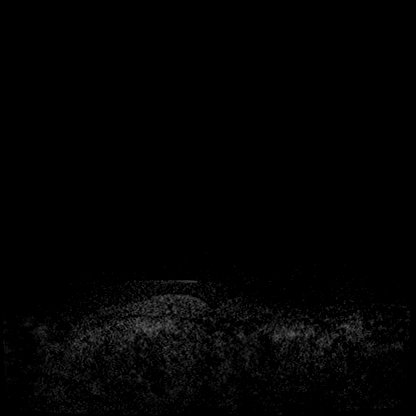
[im 28/112]
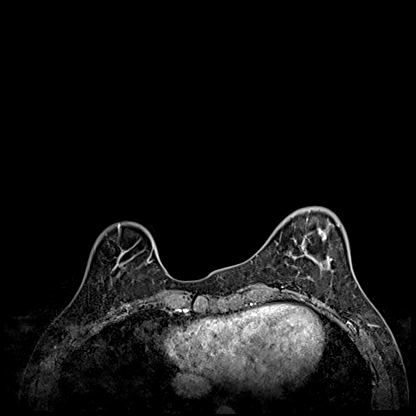
[im 56/112]
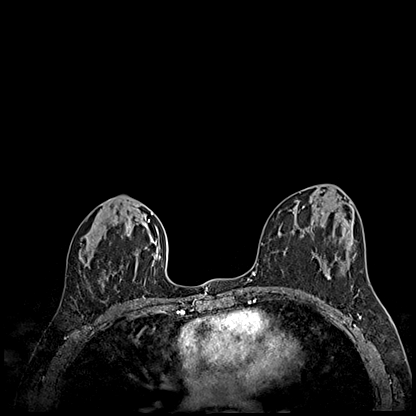
[im 84/112]
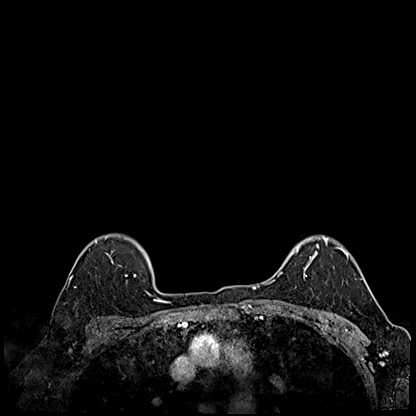
[im 112/112]
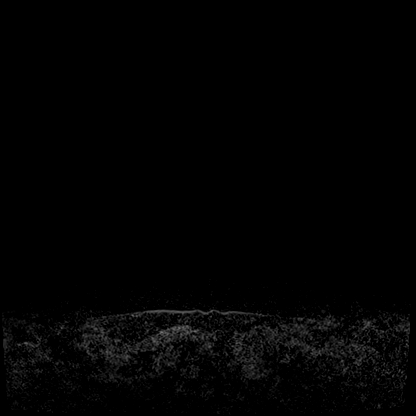

[Series 7: T1 · axial · 1.6mm · 0.77mm/px · z∈[-86,+91]mm · 5 of 112 slices shown (1 of 3)]
[im 1/112]
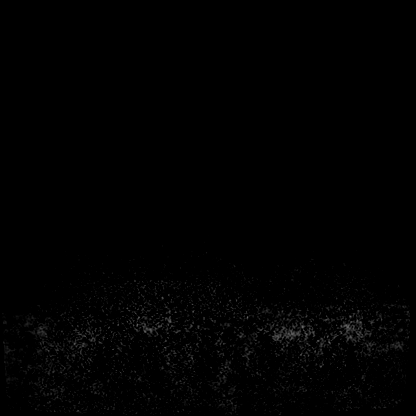
[im 28/112]
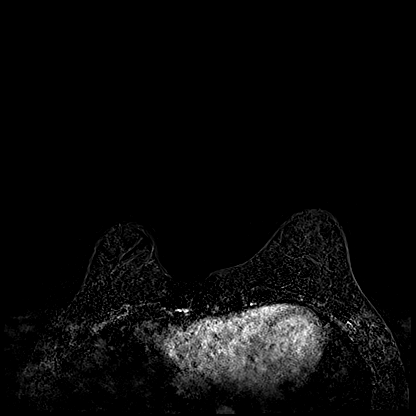
[im 56/112]
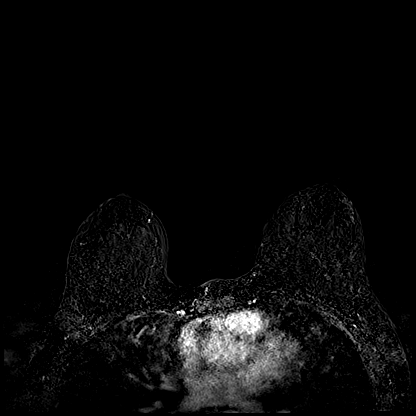
[im 84/112]
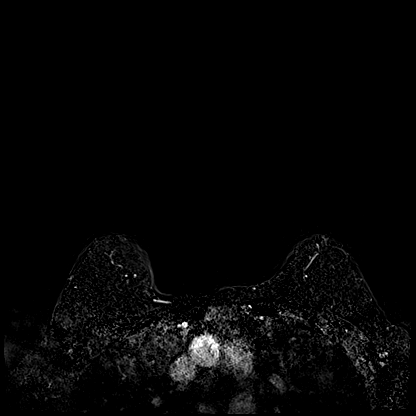
[im 112/112]
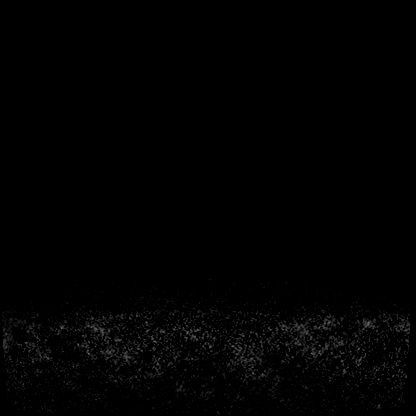

[Series 8: T1 · coronal · 320.0mm · 0.77mm/px · 1 of 3 slices shown (2 of 3)]
[im 1/3]
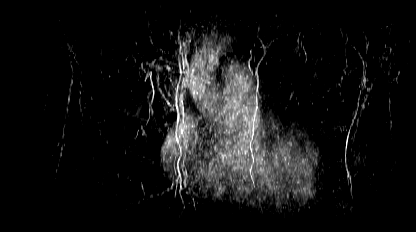

[Series 9: T1 · axial · 179.2mm · 0.77mm/px · 1 of 3 slices shown (3 of 3)]
[im 1/3]
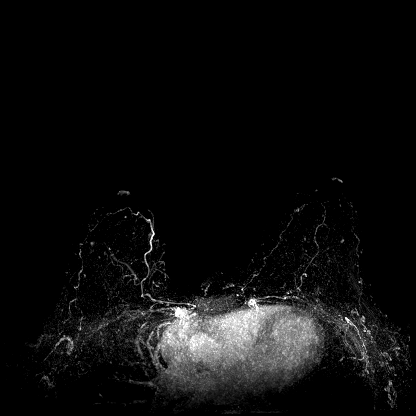

[Series 10: T1 fat-sat · axial · 1.6mm · 0.77mm/px · z∈[-86,-43]mm · 2 of 112 slices shown (4 of 4)]
[im 1/112]
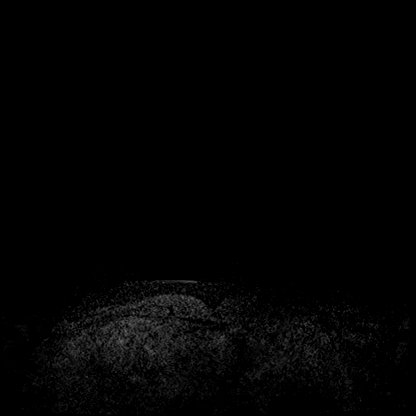
[im 28/112]
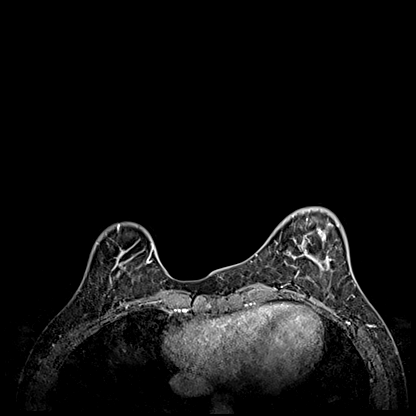

[30 of 48 positions shown; findings below may reference images not displayed]

Three-dimensional MR images were rendered by post-processing of the
original MR data on an independent workstation. The
three-dimensional MR images were interpreted, and findings are
reported in the following complete MRI report for this study. Three
dimensional images were evaluated at the independent interpreting
workstation using the DynaCAD thin client.
FINDINGS: Breast composition: c. Heterogeneous fibroglandular tissue.

Background parenchymal enhancement: Moderate.

Right breast: No mass or abnormal enhancement.

Left breast: No mass or abnormal enhancement.

Lymph nodes: No abnormal appearing lymph nodes.

Ancillary findings:  None.
IMPRESSION: No abnormal enhancement in either breast.

RECOMMENDATION:
Bilateral screening mammogram in Monday January, 2022 is recommended.

The American Cancer Society recommends annual MRI and mammography in
patients with an estimated lifetime risk of developing breast cancer
greater than 20 - 25%, or who are known or suspected to be positive
for the breast cancer gene.

BI-RADS CATEGORY  1: Negative.

## 2022-02-25 ENCOUNTER — Other Ambulatory Visit: Payer: Self-pay | Admitting: Obstetrics and Gynecology

## 2022-02-25 DIAGNOSIS — Z803 Family history of malignant neoplasm of breast: Secondary | ICD-10-CM

## 2022-03-13 ENCOUNTER — Ambulatory Visit
Admission: RE | Admit: 2022-03-13 | Discharge: 2022-03-13 | Disposition: A | Discharge status: Home / Self Care | Payer: No Typology Code available for payment source | Source: Ambulatory Visit | Attending: Obstetrics and Gynecology | Admitting: Obstetrics and Gynecology

## 2022-03-13 DIAGNOSIS — Z803 Family history of malignant neoplasm of breast: Secondary | ICD-10-CM

## 2022-03-13 MED ORDER — GADOBUTROL 1 MMOL/ML IV SOLN
6.0000 mL | Freq: Once | INTRAVENOUS | Status: AC | PRN
  Administered 2022-03-13: 6 mL via INTRAVENOUS

## 2023-02-25 ENCOUNTER — Other Ambulatory Visit: Payer: Self-pay | Admitting: Obstetrics and Gynecology

## 2023-02-25 DIAGNOSIS — Z803 Family history of malignant neoplasm of breast: Secondary | ICD-10-CM

## 2023-05-07 ENCOUNTER — Other Ambulatory Visit: Payer: No Typology Code available for payment source

## 2023-05-21 ENCOUNTER — Encounter: Payer: Self-pay | Admitting: Obstetrics and Gynecology

## 2023-05-25 ENCOUNTER — Encounter: Payer: Self-pay | Admitting: Obstetrics and Gynecology

## 2023-05-26 ENCOUNTER — Encounter: Payer: Self-pay | Admitting: Obstetrics and Gynecology

## 2023-05-28 ENCOUNTER — Other Ambulatory Visit: Payer: No Typology Code available for payment source

## 2023-06-11 ENCOUNTER — Ambulatory Visit
Admission: RE | Admit: 2023-06-11 | Discharge: 2023-06-11 | Disposition: A | Discharge status: Home / Self Care | Payer: No Typology Code available for payment source | Source: Ambulatory Visit | Attending: Obstetrics and Gynecology | Admitting: Obstetrics and Gynecology

## 2023-06-11 DIAGNOSIS — Z803 Family history of malignant neoplasm of breast: Secondary | ICD-10-CM

## 2023-06-11 MED ORDER — GADOPICLENOL 0.5 MMOL/ML IV SOLN
6.0000 mL | Freq: Once | INTRAVENOUS | Status: AC | PRN
  Administered 2023-06-11: 6 mL via INTRAVENOUS

## 2024-04-11 ENCOUNTER — Other Ambulatory Visit: Payer: Self-pay

## 2024-04-11 ENCOUNTER — Other Ambulatory Visit (HOSPITAL_COMMUNITY): Payer: Self-pay

## 2024-04-11 ENCOUNTER — Other Ambulatory Visit: Payer: Self-pay | Admitting: Obstetrics and Gynecology

## 2024-04-11 DIAGNOSIS — Z1231 Encounter for screening mammogram for malignant neoplasm of breast: Secondary | ICD-10-CM

## 2024-04-11 MED ORDER — VYLEESI 1.75 MG/0.3ML ~~LOC~~ SOAJ: SUBCUTANEOUS | 0 refills | Status: DC

## 2024-04-24 ENCOUNTER — Encounter: Payer: Self-pay | Admitting: Pediatrics

## 2024-05-16 ENCOUNTER — Other Ambulatory Visit (HOSPITAL_COMMUNITY): Payer: Self-pay

## 2024-05-16 MED ORDER — QUVIVIQ 50 MG PO TABS
50.0000 mg | ORAL_TABLET | Freq: Every day | ORAL | 3 refills | Status: AC
  Filled 2024-05-16 (×2): qty 30, 30d supply, fill #0

## 2024-05-17 ENCOUNTER — Other Ambulatory Visit: Payer: Self-pay

## 2024-05-26 ENCOUNTER — Encounter

## 2024-06-08 ENCOUNTER — Encounter: Admitting: Pediatrics

## 2024-06-12 ENCOUNTER — Ambulatory Visit

## 2024-06-12 VITALS — Ht 63.5 in | Wt 125.0 lb

## 2024-06-12 DIAGNOSIS — Z1211 Encounter for screening for malignant neoplasm of colon: Secondary | ICD-10-CM

## 2024-06-12 MED ORDER — NA SULFATE-K SULFATE-MG SULF 17.5-3.13-1.6 GM/177ML PO SOLN: 1.0000 | Freq: Once | ORAL | 0 refills | Status: AC

## 2024-06-12 NOTE — Progress Notes (Signed)

## 2024-06-20 ENCOUNTER — Encounter: Payer: Self-pay | Admitting: Pediatrics

## 2024-06-21 NOTE — Progress Notes (Signed)
 [facility] History and Physical   Primary Care Physician:  Samie Frederick, PA-C   Reason for Procedure:  Colorectal cancer screening  Plan:    Greening colonoscopy   The patient was provided an opportunity to ask questions and all were answered. The patient agreed with the plan.   HPI: Emily Solomon is a 53 y.o. female undergoing screening colonoscopy for colorectal cancer screening.  This is the patient's first colonoscopy.  Cologuard negative in 2021.  No documented family history of colorectal cancer or polyps.  Patient denies current symptoms of rectal bleeding or change in bowel habits.   Past Medical History:  Diagnosis Date   Normal labor 04/28/2012   NSVD (normal spontaneous vaginal delivery, 9/26) 04/28/2012   Postpartum care following vaginal delivery 04/28/2012    Past Surgical History:  Procedure Laterality Date   TEMPOROMANDIBULAR JOINT SURGERY      Prior to Admission medications   Medication Sig Start Date End Date Taking? Authorizing Provider  bimatoprost (LATISSE) 0.03 % ophthalmic solution  07/30/20   [provider]  butalbital-acetaminophen -caffeine (FIORICET) 50-325-40 MG tablet Take 1 tablet by mouth every 6 (six) hours as needed. 03/24/24   [provider]  Daridorexant  HCl (QUVIVIQ ) 50 MG TABS Take 1 tablet (50 mg total) by mouth daily. 05/16/24     hydrOXYzine (ATARAX/VISTARIL) 25 MG tablet Take 1-2 tablets by mouth at bedtime as needed. 08/07/20   [provider]  NONFORMULARY OR COMPOUNDED ITEM Inject 1 Dose into the skin every 7 (seven) days. Semaglutide    [provider]  progesterone  (PROMETRIUM ) 100 MG capsule Take 1 capsule (100 mg total) by mouth at bedtime. 08/12/20   Lavoie, Marie-Lyne, MD  zolpidem  (AMBIEN ) 10 MG tablet TAKE 1 TABLET(10 MG) BY MOUTH AT BEDTIME AS NEEDED FOR SLEEP 08/13/19   [provider]    Current Outpatient Medications  Medication Sig Dispense Refill   Daridorexant  HCl  (QUVIVIQ ) 50 MG TABS Take 1 tablet (50 mg total) by mouth daily. 30 tablet 3   estrogens, conjugated, (PREMARIN) 0.45 MG tablet Take 0.45 mg by mouth daily. Pt unsure of dose     hydrOXYzine (ATARAX/VISTARIL) 25 MG tablet Take 1-2 tablets by mouth at bedtime as needed.     progesterone  (PROMETRIUM ) 100 MG capsule Take 1 capsule (100 mg total) by mouth at bedtime. 90 capsule 4   zolpidem  (AMBIEN ) 10 MG tablet TAKE 1 TABLET(10 MG) BY MOUTH AT BEDTIME AS NEEDED FOR SLEEP     bimatoprost (LATISSE) 0.03 % ophthalmic solution  (Patient not taking: Reported on 06/12/2024)     butalbital-acetaminophen -caffeine (FIORICET) 50-325-40 MG tablet Take 1 tablet by mouth every 6 (six) hours as needed.     NONFORMULARY OR COMPOUNDED ITEM Inject 1 Dose into the skin every 7 (seven) days. Semaglutide     Current Facility-Administered Medications  Medication Dose Route Frequency Provider Last Rate Last Admin   0.9 %  sodium chloride  infusion  500 mL Intravenous Once Nicholson Starace M, MD        Allergies as of 06/23/2024 - Review Complete 06/23/2024  Allergen Reaction Noted   Codeine Nausea And Vomiting 04/28/2012    Family History  Problem Relation Age of Onset   Breast cancer Mother 36   Hypertension Mother    Breast cancer Maternal Grandfather        colon?   Colon cancer Neg Hx    Rectal cancer Neg Hx    Stomach cancer Neg Hx    Esophageal cancer  Neg Hx     Social History   Socioeconomic History   Marital status: Married    Spouse name: Not on file   Number of children: Not on file   Years of education: Not on file   Highest education level: Not on file  Occupational History   Not on file  Tobacco Use   Smoking status: Never   Smokeless tobacco: Never  Vaping Use   Vaping status: Never Used  Substance and Sexual Activity   Alcohol use: Yes    Comment: 9 DRINKS A WEEK  - WINE   Drug use: No   Sexual activity: Yes    Partners: Male    Comment: 1st intercourse- 86, partners- 4,  married- 10 yrs  Other Topics Concern   Not on file  Social History Narrative   Not on file   Social Drivers of Health   Financial Resource Strain: Low Risk  (03/23/2024)   Received from [facility]   Overall Financial Resource Strain (CARDIA)    How hard is it for you to pay for the very basics like food, housing, medical care, and heating?: Not hard at all  Food Insecurity: No Food Insecurity (03/23/2024)   Received from [facility]   Hunger Vital Sign    Within the past 12 months, you worried that your food would run out before you got the money to buy more.: Never true    Within the past 12 months, the food you bought just didn't last and you didn't have money to get more.: Never true  Transportation Needs: No Transportation Needs (03/23/2024)   Received from [facility] - Transportation    In the past 12 months, has lack of transportation kept you from medical appointments or from getting medications?: No    In the past 12 months, has lack of transportation kept you from meetings, work, or from getting things needed for daily living?: No  Physical Activity: Sufficiently Active (03/23/2024)   Received from [facility]   Exercise Vital Sign    On average, how many days per week do you engage in moderate to strenuous exercise (like a brisk walk)?: 5 days    On average, how many minutes do you engage in exercise at this level?: 30 min  Stress: No Stress Concern Present (03/23/2024)   Received from [facility] - Occupational Stress Questionnaire    Do you feel stress - tense, restless, nervous, or anxious, or unable to sleep at night because your mind is troubled all the time - these days?: Only a little  Social Connections: Socially Integrated (03/23/2024)   Received from [facility]   Social Network    How would you rate your social network (family, work, friends)?: Good participation with social networks  Intimate  Partner Violence: Not At Risk (03/23/2024)   Received from [facility]   HITS    Over the last 12 months how often did your partner physically hurt you?: Never    Over the last 12 months how often did your partner insult you or talk down to you?: Never    Over the last 12 months how often did your partner threaten you with physical harm?: Never    Over the last 12 months how often did your partner scream or curse at you?: Never    Review of Systems:  All other review of systems negative except as mentioned in the HPI.  Physical  Exam: Vital signs BP (!) 101/56   Pulse 82   Temp (!) 97.4 F (36.3 C) (Temporal)   Resp 10   Ht 5' 3.5 (1.613 m)   Wt 125 lb (56.7 kg)   LMP 04/12/2020   SpO2 100%   BMI 21.80 kg/m   General:   Alert,  Well-developed, well-nourished, pleasant and cooperative in NAD Airway:  Mallampati 2 Lungs:  Clear throughout to auscultation.   Heart:  Regular rate and rhythm; no murmurs, clicks, rubs,  or gallops. Abdomen:  Soft, nontender and nondistended. Normal bowel sounds.   Neuro/Psych:  Normal mood and affect. A and O x 3  Inocente Hausen, MD [facility]

## 2024-06-23 ENCOUNTER — Ambulatory Visit: Admitting: Pediatrics

## 2024-06-23 ENCOUNTER — Encounter: Payer: Self-pay | Admitting: Pediatrics

## 2024-06-23 VITALS — BP 109/69 | HR 73 | Temp 97.4°F | Resp 11 | Ht 63.5 in | Wt 125.0 lb

## 2024-06-23 DIAGNOSIS — K648 Other hemorrhoids: Secondary | ICD-10-CM | POA: Diagnosis not present

## 2024-06-23 DIAGNOSIS — K635 Polyp of colon: Secondary | ICD-10-CM | POA: Diagnosis not present

## 2024-06-23 DIAGNOSIS — D125 Benign neoplasm of sigmoid colon: Secondary | ICD-10-CM

## 2024-06-23 DIAGNOSIS — Z1211 Encounter for screening for malignant neoplasm of colon: Secondary | ICD-10-CM

## 2024-06-23 DIAGNOSIS — D128 Benign neoplasm of rectum: Secondary | ICD-10-CM

## 2024-06-23 DIAGNOSIS — D124 Benign neoplasm of descending colon: Secondary | ICD-10-CM

## 2024-06-23 MED ORDER — SODIUM CHLORIDE 0.9 % IV SOLN: 500.0000 mL | Freq: Once | INTRAVENOUS | Status: DC

## 2024-06-23 NOTE — Progress Notes (Signed)
 Pt's states no medical or surgical changes since previsit or office visit.

## 2024-06-23 NOTE — Op Note (Signed)
 [facility] Patient Name: Emily Solomon Procedure Date: 06/23/2024 9:33 AM MRN: [id number] Endoscopist: Inocente Hausen , MD, [id number] Age: 53 Referring MD:  Date of Birth: May 14, 1971 Gender: Female Account #: [id number] Procedure:                Colonoscopy Indications:              Screening for colorectal malignant neoplasm, This                            is the patient's first colonoscopy Medicines:                Monitored Anesthesia Care Procedure:                Pre-Anesthesia Assessment:                           - Prior to the procedure, a History and Physical                            was performed, and patient medications and                            allergies were reviewed. The patient's tolerance of                            previous anesthesia was also reviewed. The risks                            and benefits of the procedure and the sedation                            options and risks were discussed with the patient.                            All questions were answered, and informed consent                            was obtained. Prior Anticoagulants: The patient has                            taken no anticoagulant or antiplatelet agents. ASA                            Grade Assessment: I - A normal, healthy patient.                            After reviewing the risks and benefits, the patient                            was deemed in satisfactory condition to undergo the                            procedure.  After obtaining informed consent, the colonoscope                            was passed under direct vision. Throughout the                            procedure, the patient's blood pressure, pulse, and                            oxygen saturations were monitored continuously. The                            Olympus Scope [id number] was introduced through the                            anus and advanced to the cecum,  identified by                            appendiceal orifice and ileocecal valve. The                            colonoscopy was performed without difficulty. The                            patient tolerated the procedure well. The quality                            of the bowel preparation was good. The ileocecal                            valve, appendiceal orifice, and rectum were                            photographed. Scope In: 9:40:16 AM Scope Out: 9:56:23 AM Scope Withdrawal Time: 0 hours 13 minutes 0 seconds  Total Procedure Duration: 0 hours 16 minutes 7 seconds  Findings:                 The perianal and digital rectal examinations were                            normal. Pertinent negatives include no palpable                            rectal lesions.                           A 6 mm polyp was found in the descending colon. The                            polyp was sessile. The polyp was removed with a                            cold snare. Resection was complete, but the polyp  tissue was not retrieved.                           A 9 mm polyp was found in the sigmoid colon. The                            polyp was sessile. The polyp was removed with a                            cold snare. Resection and retrieval were complete.                           A 4 mm polyp was found in the rectum. The polyp was                            sessile. The polyp was removed with a cold snare.                            Resection and retrieval were complete.                           Internal hemorrhoids were found during retroflexion. Complications:            No immediate complications. Estimated blood loss:                            Minimal. Estimated Blood Loss:     Estimated blood loss was minimal. Impression:               - One 6 mm polyp in the descending colon, removed                            with a cold snare. Complete resection. Polyp tissue                             not retrieved.                           - One 9 mm polyp in the sigmoid colon, removed with                            a cold snare. Resected and retrieved.                           - One 4 mm polyp in the rectum, removed with a cold                            snare. Resected and retrieved.                           - Internal hemorrhoids. Recommendation:           - Discharge patient to home.                           -  Await pathology results.                           - Repeat colonoscopy for surveillance based on                            pathology results.                           - The findings and recommendations were discussed                            with the patient's family.                           - Return to referring physician.                           - Patient has a contact number available for                            emergencies. The signs and symptoms of potential                            delayed complications were discussed with the                            patient. Return to normal activities tomorrow.                            Written discharge instructions were provided to the                            patient. Inocente Hausen, MD 06/23/2024 10:03:53 AM This report has been signed electronically.

## 2024-06-23 NOTE — Patient Instructions (Signed)
 Resume previous diet and medications. Awaiting pathology results. Repeat Colonoscopy date to be determined based on pathology results. Handouts provided on colon polyps  YOU HAD AN ENDOSCOPIC PROCEDURE TODAY AT THE  ENDOSCOPY CENTER:   Refer to the procedure report that was given to you for any specific questions about what was found during the examination.  If the procedure report does not answer your questions, please call your gastroenterologist to clarify.  If you requested that your care partner not be given the details of your procedure findings, then the procedure report has been included in a sealed envelope for you to review at your convenience later.  YOU SHOULD EXPECT: Some feelings of bloating in the abdomen. Passage of more gas than usual.  Walking can help get rid of the air that was put into your GI tract during the procedure and reduce the bloating. If you had a lower endoscopy (such as a colonoscopy or flexible sigmoidoscopy) you may notice spotting of blood in your stool or on the toilet paper. If you underwent a bowel prep for your procedure, you may not have a normal bowel movement for a few days.  Please Note:  You might notice some irritation and congestion in your nose or some drainage.  This is from the oxygen used during your procedure.  There is no need for concern and it should clear up in a day or so.  SYMPTOMS TO REPORT IMMEDIATELY:  Following lower endoscopy (colonoscopy or flexible sigmoidoscopy):  Excessive amounts of blood in the stool  Significant tenderness or worsening of abdominal pains  Swelling of the abdomen that is new, acute  Fever of 100F or higher  For urgent or emergent issues, a gastroenterologist can be reached at any hour by calling [phone]. Do not use MyChart messaging for urgent concerns.    DIET:  We do recommend a small meal at first, but then you may proceed to your regular diet.  Drink plenty of fluids but you should avoid  alcoholic beverages for 24 hours.  ACTIVITY:  You should plan to take it easy for the rest of today and you should NOT DRIVE or use heavy machinery until tomorrow (because of the sedation medicines used during the test).    FOLLOW UP: Our staff will call the number listed on your records the next business day following your procedure.  We will call around 7:15- 8:00 am to check on you and address any questions or concerns that you may have regarding the information given to you following your procedure. If we do not reach you, we will leave a message.     If any biopsies were taken you will be contacted by phone or by letter within the next 1-3 weeks.  Please call us at [phone] if you have not heard about the biopsies in 3 weeks.    SIGNATURES/CONFIDENTIALITY: You and/or your care partner have signed paperwork which will be entered into your electronic medical record.  These signatures attest to the fact that that the information above on your After Visit Summary has been reviewed and is understood.  Full responsibility of the confidentiality of this discharge information lies with you and/or your care-partner.

## 2024-06-23 NOTE — Progress Notes (Signed)
 Called to room to assist during endoscopic procedure.  Patient ID and intended procedure confirmed with present staff. Received instructions for my participation in the procedure from the performing physician.

## 2024-06-23 NOTE — Progress Notes (Signed)
 To pacu, VSS. Report to [email or web address]

## 2024-06-26 ENCOUNTER — Telehealth: Payer: Self-pay

## 2024-06-26 NOTE — Telephone Encounter (Signed)
 No answer on follow-up call. Left VM for pt.

## 2024-06-28 LAB — SURGICAL PATHOLOGY

## 2024-07-01 ENCOUNTER — Ambulatory Visit: Payer: Self-pay | Admitting: Pediatrics

## 2024-07-13 ENCOUNTER — Other Ambulatory Visit: Payer: Self-pay | Admitting: Obstetrics and Gynecology

## 2024-07-13 DIAGNOSIS — Z9189 Other specified personal risk factors, not elsewhere classified: Secondary | ICD-10-CM

## 2024-08-14 ENCOUNTER — Encounter: Payer: Self-pay | Admitting: Obstetrics and Gynecology

## 2024-08-16 ENCOUNTER — Encounter: Payer: Self-pay | Admitting: Obstetrics and Gynecology

## 2024-08-17 ENCOUNTER — Encounter: Payer: Self-pay | Admitting: Obstetrics and Gynecology

## 2024-08-22 ENCOUNTER — Ambulatory Visit
Admission: RE | Admit: 2024-08-22 | Discharge: 2024-08-22 | Disposition: A | Source: Ambulatory Visit | Attending: Obstetrics and Gynecology | Admitting: Obstetrics and Gynecology

## 2024-08-22 DIAGNOSIS — Z9189 Other specified personal risk factors, not elsewhere classified: Secondary | ICD-10-CM

## 2024-08-22 MED ORDER — GADOPICLENOL 0.5 MMOL/ML IV SOLN
6.0000 mL | Freq: Once | INTRAVENOUS | Status: AC | PRN
  Administered 2024-08-22: 6 mL via INTRAVENOUS
